# Patient Record
Sex: Female | Born: 1955
Health system: Southern US, Community
[De-identification: ages and names within clinical notes are randomized; demographics above are authoritative.]

## PROBLEM LIST (undated history)

## (undated) DIAGNOSIS — E7800 Pure hypercholesterolemia, unspecified: Secondary | ICD-10-CM

## (undated) DIAGNOSIS — E119 Type 2 diabetes mellitus without complications: Secondary | ICD-10-CM

## (undated) DIAGNOSIS — M1990 Unspecified osteoarthritis, unspecified site: Secondary | ICD-10-CM

## (undated) DIAGNOSIS — I10 Essential (primary) hypertension: Secondary | ICD-10-CM

## (undated) HISTORY — PX: CHOLECYSTECTOMY: SHX55

---

## 2009-02-08 ENCOUNTER — Emergency Department (HOSPITAL_BASED_OUTPATIENT_CLINIC_OR_DEPARTMENT_OTHER): Admission: EM | Admit: 2009-02-08 | Discharge: 2009-02-08 | Payer: Self-pay | Admitting: Emergency Medicine

## 2011-04-07 ENCOUNTER — Emergency Department (INDEPENDENT_AMBULATORY_CARE_PROVIDER_SITE_OTHER): Payer: Self-pay

## 2011-04-07 ENCOUNTER — Emergency Department (HOSPITAL_BASED_OUTPATIENT_CLINIC_OR_DEPARTMENT_OTHER)
Admission: EM | Admit: 2011-04-07 | Discharge: 2011-04-07 | Disposition: A | Discharge status: Home / Self Care | Payer: Self-pay | Attending: Emergency Medicine | Admitting: Emergency Medicine

## 2011-04-07 DIAGNOSIS — E7800 Pure hypercholesterolemia, unspecified: Secondary | ICD-10-CM | POA: Insufficient documentation

## 2011-04-07 DIAGNOSIS — I10 Essential (primary) hypertension: Secondary | ICD-10-CM | POA: Insufficient documentation

## 2011-04-07 DIAGNOSIS — J3489 Other specified disorders of nose and nasal sinuses: Secondary | ICD-10-CM | POA: Insufficient documentation

## 2011-04-07 DIAGNOSIS — R0981 Nasal congestion: Secondary | ICD-10-CM

## 2011-04-07 DIAGNOSIS — R198 Other specified symptoms and signs involving the digestive system and abdomen: Secondary | ICD-10-CM

## 2011-04-07 DIAGNOSIS — J069 Acute upper respiratory infection, unspecified: Secondary | ICD-10-CM | POA: Insufficient documentation

## 2011-04-07 DIAGNOSIS — R059 Cough, unspecified: Secondary | ICD-10-CM

## 2011-04-07 DIAGNOSIS — R51 Headache: Secondary | ICD-10-CM | POA: Insufficient documentation

## 2011-04-07 DIAGNOSIS — R05 Cough: Secondary | ICD-10-CM

## 2011-04-07 HISTORY — DX: Essential (primary) hypertension: I10

## 2011-04-07 HISTORY — DX: Pure hypercholesterolemia, unspecified: E78.00

## 2011-04-07 MED ORDER — ACETAMINOPHEN-CODEINE 120-12 MG/5ML PO SUSP: 10.0000 mL | ORAL | Status: AC

## 2011-04-07 MED ORDER — PREDNISONE 50 MG PO TABS: 50.0000 mg | ORAL_TABLET | Freq: Every day | ORAL | Status: AC

## 2011-04-07 NOTE — ED Notes (Signed)
C/o HA/facial pain,sinus congestion

## 2011-04-07 NOTE — ED Provider Notes (Signed)
History     CSN: [id number] Arrival date & time: 04/07/2011 11:33 AM   First MD Initiated Contact with Patient 04/07/11 1158      Chief Complaint  Patient presents with  . Facial Pain    (Consider location/radiation/quality/duration/timing/severity/associated sxs/prior treatment) The history is provided by the patient.   patient presents emergency Dept. with a two-day history of nasal congestion, runny nose, sore throat, cough, and chills.  The patient denies fever, vomiting, nausea, diarrhea, abdominal pain, chest pain,  shortness of breath, weakness, or myalgias.  She states that her her nose, and face are causing pain from the nasal congestion.   Past Medical History  Diagnosis Date  . Hypertension   . High cholesterol     Past Surgical History  Procedure Date  . Cholecystectomy     No family history on file.  History  Substance Use Topics  . Smoking status: Current Everyday Smoker  . Smokeless tobacco: Not on file  . Alcohol Use: No    OB History    Grav Para Term Preterm Abortions TAB SAB Ect Mult Living                  Review of Systems  All other systems reviewed and are negative.   All pertinent positives and negatives in the history of present illness. Allergies  Penicillins  Home Medications   Current Outpatient Rx  Name Route Sig Dispense Refill  . UNKNOWN TO PATIENT  BP/cholesterol meds       BP 148/86  Pulse 94  Temp(Src) 97.5 F (36.4 C) (Oral)  Resp 20  Wt 197 lb (89.359 kg)  SpO2 100%  Physical Exam  Constitutional: She is oriented to person, place, and time. She appears well-developed and well-nourished. No distress.  HENT:  Head: No trismus in the jaw.  Right Ear: Hearing, tympanic membrane and ear canal normal.  Left Ear: Hearing, tympanic membrane and ear canal normal.  Nose: Mucosal edema and rhinorrhea present. No epistaxis. Right sinus exhibits no maxillary sinus tenderness and no frontal sinus tenderness. Left sinus  exhibits no maxillary sinus tenderness and no frontal sinus tenderness.    Mouth/Throat: Uvula is midline, oropharynx is clear and moist and mucous membranes are normal. No uvula swelling. No oropharyngeal exudate, posterior oropharyngeal edema, posterior oropharyngeal erythema or tonsillar abscesses.  Eyes: Conjunctivae are normal. Pupils are equal, round, and reactive to light.  Neck: Normal range of motion. Neck supple.  Cardiovascular: Normal rate and regular rhythm.  Exam reveals no gallop and no friction rub.   No murmur heard. Pulmonary/Chest: Effort normal and breath sounds normal. No respiratory distress. She has no wheezes. She has no rales.  Neurological: She is alert and oriented to person, place, and time.  Skin: Skin is warm and dry. No rash noted. She is not diaphoretic.    ED Course  Procedures (including critical care time)  Labs Reviewed - No data to display Dg Chest 2 View  04/07/2011  *RADIOLOGY REPORT*  Clinical Data: Cough and chest congestion.  CHEST - 2 VIEW  Comparison: None.  Findings: The heart size and vascularity are normal and the lungs are clear.  No osseous abnormality.  IMPRESSION: Normal chest.  Original Report Authenticated By: Gwynn Burly, M.D.     Chest her x-ray was obtained due to the fact, the patient, states she has some discomfort with cough and burning sensation.     MDM  Patient has normal vital signs here in the emergency  department.  Her symptoms were most likely caused by viral upper respiratory illness is based on her history of present illness and physical examination.  Her chest x-ray was also negative as well.  She is told to increase her fluids.  Return here as needed for any worsening in her condition.        Jamesetta Orleans [place], [place] 04/07/11 1258

## 2011-04-10 NOTE — ED Provider Notes (Signed)
Medical screening examination/treatment/procedure(s) were performed by non-physician practitioner and as supervising physician I was immediately available for consultation/collaboration.    Suzi Roots, MD 04/10/11 [phone]

## 2012-01-14 ENCOUNTER — Encounter (HOSPITAL_BASED_OUTPATIENT_CLINIC_OR_DEPARTMENT_OTHER): Payer: Self-pay | Admitting: *Deleted

## 2012-01-14 ENCOUNTER — Emergency Department (HOSPITAL_BASED_OUTPATIENT_CLINIC_OR_DEPARTMENT_OTHER): Payer: Self-pay

## 2012-01-14 ENCOUNTER — Emergency Department (HOSPITAL_BASED_OUTPATIENT_CLINIC_OR_DEPARTMENT_OTHER)
Admission: EM | Admit: 2012-01-14 | Discharge: 2012-01-14 | Disposition: A | Discharge status: Home / Self Care | Payer: Self-pay | Attending: Emergency Medicine | Admitting: Emergency Medicine

## 2012-01-14 DIAGNOSIS — J40 Bronchitis, not specified as acute or chronic: Secondary | ICD-10-CM | POA: Insufficient documentation

## 2012-01-14 DIAGNOSIS — E7800 Pure hypercholesterolemia, unspecified: Secondary | ICD-10-CM | POA: Insufficient documentation

## 2012-01-14 DIAGNOSIS — I10 Essential (primary) hypertension: Secondary | ICD-10-CM | POA: Insufficient documentation

## 2012-01-14 DIAGNOSIS — Z880 Allergy status to penicillin: Secondary | ICD-10-CM | POA: Insufficient documentation

## 2012-01-14 DIAGNOSIS — F17200 Nicotine dependence, unspecified, uncomplicated: Secondary | ICD-10-CM | POA: Insufficient documentation

## 2012-01-14 MED ORDER — AZITHROMYCIN 250 MG PO TABS: 250.0000 mg | ORAL_TABLET | Freq: Every day | ORAL | Status: AC

## 2012-01-14 MED ORDER — HYDROCOD POLST-CHLORPHEN POLST 10-8 MG/5ML PO LQCR: 5.0000 mL | Freq: Every evening | ORAL | Status: DC | PRN

## 2012-01-14 NOTE — ED Provider Notes (Signed)
History     CSN: [id number]  Arrival date & time 01/14/12  4098   First MD Initiated Contact with Patient 01/14/12 1007      Chief Complaint  Patient presents with  . URI    (Consider location/radiation/quality/duration/timing/severity/associated sxs/prior treatment) Patient is a 56 y.o. female presenting with cough. The history is provided by the patient.  Cough This is a new problem. The current episode started more than 2 days ago. The problem occurs constantly. The problem has been gradually worsening. The cough is productive of sputum. There has been no fever. Associated symptoms include headaches and rhinorrhea. Pertinent negatives include no chest pain, no chills, no sore throat, no myalgias, no shortness of breath and no wheezing. Treatments tried: allergy med. The treatment provided no relief. She is a smoker. Her past medical history does not include COPD.    Past Medical History  Diagnosis Date  . Hypertension   . High cholesterol     Past Surgical History  Procedure Date  . Cholecystectomy     No family history on file.  History  Substance Use Topics  . Smoking status: Current Everyday Smoker  . Smokeless tobacco: Not on file  . Alcohol Use: No    OB History    Grav Para Term Preterm Abortions TAB SAB Ect Mult Living                  Review of Systems  Constitutional: Negative for chills.  HENT: Positive for congestion and rhinorrhea. Negative for sore throat and neck pain.   Respiratory: Positive for cough. Negative for shortness of breath and wheezing.   Cardiovascular: Negative for chest pain.  Gastrointestinal: Positive for vomiting (post tussive). Negative for nausea.  Musculoskeletal: Negative for myalgias.  Neurological: Positive for headaches.    Allergies  Penicillins  Home Medications   Current Outpatient Rx  Name Route Sig Dispense Refill  . SIMVASTATIN PO Oral Take by mouth.    . AZITHROMYCIN 250 MG PO TABS Oral Take 1 tablet (250  mg total) by mouth daily. Take first 2 tablets together, then 1 every day until finished. 6 tablet 0  . HYDROCOD POLST-CPM POLST ER 10-8 MG/5ML PO LQCR Oral Take 5 mLs by mouth at bedtime as needed. 140 mL 0  . UNKNOWN TO PATIENT  BP/cholesterol meds       BP 153/86  Pulse 94  Temp 98.8 F (37.1 C) (Oral)  Resp 18  Ht 5\' 5"  (1.651 m)  Wt 197 lb (89.359 kg)  BMI 32.78 kg/m2  SpO2 97%  Physical Exam  Constitutional: She is oriented to person, place, and time. She appears well-developed and well-nourished.  HENT:  Head: Normocephalic and atraumatic.  Right Ear: External ear normal.  Left Ear: External ear normal.  Mouth/Throat: Oropharynx is clear and moist.  Eyes: Pupils are equal, round, and reactive to light.  Neck: Normal range of motion. Neck supple.  Cardiovascular: Normal rate, regular rhythm and normal heart sounds.   Pulmonary/Chest: Effort normal and breath sounds normal. No respiratory distress. She has no wheezes. She has no rales. She exhibits no tenderness.  Abdominal: Soft. Bowel sounds are normal. There is no tenderness. There is no rebound and no guarding.  Musculoskeletal: Normal range of motion. She exhibits no edema.  Lymphadenopathy:    She has no cervical adenopathy.  Neurological: She is alert and oriented to person, place, and time.  Skin: Skin is warm and dry. No rash noted.  Psychiatric: She has  a normal mood and affect.    ED Course  Procedures (including critical care time) No results found for this or any previous visit. Dg Chest 2 View  01/14/2012  *RADIOLOGY REPORT*  Clinical Data: Cough, headache, vomiting and diarrhea.  CHEST - 2 VIEW  Comparison: 04/07/2011.  Findings: The heart remains normal in size and the lungs are clear. Thoracic spine degenerative changes.  Cholecystectomy clips.  IMPRESSION: No acute abnormality.   Original Report Authenticated By: Darrol Angel, M.D.        1. Bronchitis       MDM  No evidence of pneumonia.  Pt  is smoker with worsening bronchitis symptoms.  Will tx with z-pack and tussionex.  F/u with her PMD         Rolan Bucco, MD 01/14/12 1106

## 2012-01-14 NOTE — ED Notes (Signed)
Patient reports cold symptoms since thursday, productive cough yellow sputum, HA, sinus pressure, some diarrhea this AM, cough & vomits, took otc sinus meds

## 2012-09-21 ENCOUNTER — Encounter (HOSPITAL_BASED_OUTPATIENT_CLINIC_OR_DEPARTMENT_OTHER): Payer: Self-pay | Admitting: *Deleted

## 2012-09-21 ENCOUNTER — Emergency Department (HOSPITAL_BASED_OUTPATIENT_CLINIC_OR_DEPARTMENT_OTHER)
Admission: EM | Admit: 2012-09-21 | Discharge: 2012-09-21 | Disposition: A | Discharge status: Home / Self Care | Payer: Self-pay | Attending: Emergency Medicine | Admitting: Emergency Medicine

## 2012-09-21 DIAGNOSIS — Y929 Unspecified place or not applicable: Secondary | ICD-10-CM | POA: Insufficient documentation

## 2012-09-21 DIAGNOSIS — S46912A Strain of unspecified muscle, fascia and tendon at shoulder and upper arm level, left arm, initial encounter: Secondary | ICD-10-CM

## 2012-09-21 DIAGNOSIS — F17200 Nicotine dependence, unspecified, uncomplicated: Secondary | ICD-10-CM | POA: Insufficient documentation

## 2012-09-21 DIAGNOSIS — E7800 Pure hypercholesterolemia, unspecified: Secondary | ICD-10-CM | POA: Insufficient documentation

## 2012-09-21 DIAGNOSIS — Z79899 Other long term (current) drug therapy: Secondary | ICD-10-CM | POA: Insufficient documentation

## 2012-09-21 DIAGNOSIS — Y939 Activity, unspecified: Secondary | ICD-10-CM | POA: Insufficient documentation

## 2012-09-21 DIAGNOSIS — I10 Essential (primary) hypertension: Secondary | ICD-10-CM | POA: Insufficient documentation

## 2012-09-21 DIAGNOSIS — IMO0002 Reserved for concepts with insufficient information to code with codable children: Secondary | ICD-10-CM | POA: Insufficient documentation

## 2012-09-21 DIAGNOSIS — Z880 Allergy status to penicillin: Secondary | ICD-10-CM | POA: Insufficient documentation

## 2012-09-21 DIAGNOSIS — X58XXXA Exposure to other specified factors, initial encounter: Secondary | ICD-10-CM | POA: Insufficient documentation

## 2012-09-21 DIAGNOSIS — Z8739 Personal history of other diseases of the musculoskeletal system and connective tissue: Secondary | ICD-10-CM | POA: Insufficient documentation

## 2012-09-21 HISTORY — DX: Unspecified osteoarthritis, unspecified site: M19.90

## 2012-09-21 MED ORDER — IBUPROFEN 600 MG PO TABS: 600.0000 mg | ORAL_TABLET | Freq: Four times a day (QID) | ORAL | Status: DC | PRN

## 2012-09-21 MED ORDER — OXYCODONE-ACETAMINOPHEN 5-325 MG PO TABS: 2.0000 | ORAL_TABLET | ORAL | Status: DC | PRN

## 2012-09-21 MED ORDER — KETOROLAC TROMETHAMINE 30 MG/ML IJ SOLN
30.0000 mg | Freq: Once | INTRAMUSCULAR | Status: AC
  Administered 2012-09-21: 30 mg via INTRAMUSCULAR
  Filled 2012-09-21: qty 1

## 2012-09-21 MED ORDER — METHOCARBAMOL 500 MG PO TABS: 500.0000 mg | ORAL_TABLET | Freq: Two times a day (BID) | ORAL | Status: DC

## 2012-09-21 NOTE — ED Provider Notes (Signed)
History     CSN: [id number]  Arrival date & time 09/21/12  1344   First MD Initiated Contact with Patient 09/21/12 1403      Chief Complaint  Patient presents with  . Shoulder Pain    (Consider location/radiation/quality/duration/timing/severity/associated sxs/prior treatment) Patient is a 57 y.o. female presenting with shoulder pain. The history is provided by the patient.  Shoulder Pain   patient here complaining of left shoulder pain x1 week that is worse with movement. Pain localized to her left posterior shoulder and characterized as sharp. No recent trauma. No rashes. No anginal type symptoms. Has used over-the-counter medications without relief. Does have history of arthritis.  Past Medical History  Diagnosis Date  . Hypertension   . High cholesterol   . Arthritis     Past Surgical History  Procedure Laterality Date  . Cholecystectomy      History reviewed. No pertinent family history.  History  Substance Use Topics  . Smoking status: Current Every Day Smoker  . Smokeless tobacco: Not on file  . Alcohol Use: No    OB History   Grav Para Term Preterm Abortions TAB SAB Ect Mult Living                  Review of Systems  All other systems reviewed and are negative.    Allergies  Penicillins  Home Medications   Current Outpatient Rx  Name  Route  Sig  Dispense  Refill  . chlorpheniramine-HYDROcodone (TUSSIONEX PENNKINETIC ER) 10-8 MG/5ML LQCR   Oral   Take 5 mLs by mouth at bedtime as needed.   140 mL   0   . SIMVASTATIN PO   Oral   Take by mouth.         Marland Kitchen UNKNOWN TO PATIENT      BP/cholesterol meds            BP 143/78  Pulse 100  Temp(Src) 98.4 F (36.9 C) (Oral)  Resp 20  Ht 5\' 5"  (1.651 m)  Wt 190 lb (86.183 kg)  BMI 31.62 kg/m2  SpO2 100%  Physical Exam  Nursing note and vitals reviewed. Constitutional: She is oriented to person, place, and time. She appears well-developed and well-nourished.  Non-toxic appearance. No  distress.  HENT:  Head: Normocephalic and atraumatic.  Eyes: Conjunctivae, EOM and lids are normal. Pupils are equal, round, and reactive to light.  Neck: Normal range of motion. Neck supple. No tracheal deviation present. No mass present.  Cardiovascular: Normal rate, regular rhythm and normal heart sounds.  Exam reveals no gallop.   No murmur heard. Pulmonary/Chest: Effort normal and breath sounds normal. No stridor. No respiratory distress. She has no decreased breath sounds. She has no wheezes. She has no rhonchi. She has no rales.  Abdominal: Soft. Normal appearance and bowel sounds are normal. She exhibits no distension. There is no tenderness. There is no rebound and no CVA tenderness.  Musculoskeletal: Normal range of motion. She exhibits no edema and no tenderness.       Arms: Neurological: She is alert and oriented to person, place, and time. She has normal strength. No cranial nerve deficit or sensory deficit. GCS eye subscore is 4. GCS verbal subscore is 5. GCS motor subscore is 6.  Skin: Skin is warm and dry. No abrasion and no rash noted.  Psychiatric: She has a normal mood and affect. Her speech is normal and behavior is normal.    ED Course  Procedures (including critical care  time)  Labs Reviewed - No data to display No results found.   No diagnosis found.    MDM  Pt with musculoskeletal shoulder strain--will treat        Toy Baker, MD 09/21/12 1423

## 2012-09-21 NOTE — ED Notes (Signed)
Pt states she has had left shoulder and arm pain x 1 week. Denies other s/s. Hurts to move. No relief with Tylenol or Arthritis meds. Hx arthritis years ago,

## 2015-05-12 ENCOUNTER — Encounter (HOSPITAL_BASED_OUTPATIENT_CLINIC_OR_DEPARTMENT_OTHER): Payer: Self-pay | Admitting: *Deleted

## 2015-05-12 ENCOUNTER — Emergency Department (HOSPITAL_BASED_OUTPATIENT_CLINIC_OR_DEPARTMENT_OTHER)
Admission: EM | Admit: 2015-05-12 | Discharge: 2015-05-12 | Disposition: A | Discharge status: Home / Self Care | Payer: Self-pay | Attending: Emergency Medicine | Admitting: Emergency Medicine

## 2015-05-12 DIAGNOSIS — H1013 Acute atopic conjunctivitis, bilateral: Secondary | ICD-10-CM | POA: Insufficient documentation

## 2015-05-12 DIAGNOSIS — F17200 Nicotine dependence, unspecified, uncomplicated: Secondary | ICD-10-CM | POA: Insufficient documentation

## 2015-05-12 DIAGNOSIS — R0981 Nasal congestion: Secondary | ICD-10-CM | POA: Insufficient documentation

## 2015-05-12 DIAGNOSIS — J309 Allergic rhinitis, unspecified: Secondary | ICD-10-CM | POA: Insufficient documentation

## 2015-05-12 DIAGNOSIS — Z8739 Personal history of other diseases of the musculoskeletal system and connective tissue: Secondary | ICD-10-CM | POA: Insufficient documentation

## 2015-05-12 DIAGNOSIS — E7800 Pure hypercholesterolemia, unspecified: Secondary | ICD-10-CM | POA: Insufficient documentation

## 2015-05-12 DIAGNOSIS — I10 Essential (primary) hypertension: Secondary | ICD-10-CM | POA: Insufficient documentation

## 2015-05-12 DIAGNOSIS — Z79899 Other long term (current) drug therapy: Secondary | ICD-10-CM | POA: Insufficient documentation

## 2015-05-12 DIAGNOSIS — Z880 Allergy status to penicillin: Secondary | ICD-10-CM | POA: Insufficient documentation

## 2015-05-12 MED ORDER — LORATADINE 10 MG PO TABS: 10.0000 mg | ORAL_TABLET | Freq: Every day | ORAL | Status: DC

## 2015-05-12 MED ORDER — POLYMYXIN B-TRIMETHOPRIM 10000-0.1 UNIT/ML-% OP SOLN: 1.0000 [drp] | OPHTHALMIC | Status: DC

## 2015-05-12 MED ORDER — KETOTIFEN FUMARATE 0.025 % OP SOLN: 1.0000 [drp] | Freq: Two times a day (BID) | OPHTHALMIC | Status: DC

## 2015-05-12 NOTE — ED Notes (Signed)
Pt amb to triage with quick steady gait smiling in nad. Pt reports "bloodshot" eyes x 2 weeks. Denies any discharge or matting, no visual changes, no injury.

## 2015-05-12 NOTE — Discharge Instructions (Signed)
Read the information below.  Use the prescribed medication as directed.  Please discuss all new medications with your pharmacist.  You may return to the Emergency Department at any time for worsening condition or any new symptoms that concern you.     If you develop worsening pain in your eye, change in your vision, swelling around your eye, difficulty moving your eye, or fevers greater than 100.4, see your eye doctor or return to the Emergency Department immediately for a recheck.      Allergic Conjunctivitis Allergic conjunctivitis is inflammation of the clear membrane that covers the white part of your eye and the inner surface of your eyelid (conjunctiva), and it is caused by allergies. The blood vessels in the conjunctiva become inflamed, and this causes the eye to become red or pink, and it often causes itchiness in the eye. Allergic conjunctivitis cannot be spread by one person to another person (noncontagious). CAUSES This condition is caused by an allergic reaction. Common causes of an allergic reaction (allergens) include:  Dust.  Pollen.  Mold.  Animal dander or secretions. RISK FACTORS This condition is more likely to develop if you are exposed to high levels of allergens that cause the allergic reaction. This might include being outdoors when air pollen levels are high or being around animals that you are allergic to. SYMPTOMS Symptoms of this condition may include:  Eye redness.  Tearing of the eyes.  Watery eyes.  Itchy eyes.  Burning feeling in the eyes.  Clear drainage from the eyes.  Swollen eyelids. DIAGNOSIS This condition may be diagnosed by medical history and physical exam. If you have drainage from your eyes, it may be tested to rule out other causes of conjunctivitis. TREATMENT Treatment for this condition often includes medicines. These may be eye drops, ointments, or oral medicines. They may be prescription medicines or over-the-counter medicines. HOME  CARE INSTRUCTIONS  Take or apply medicines only as directed by your health care provider.  Do not touch or rub your eyes.  Do not wear contact lenses until the inflammation is gone. Wear glasses instead.  Do not wear eye makeup until the inflammation is gone.  Apply a cool, clean washcloth to your eye for 10-20 minutes, 3-4 times a day.  Try to avoid whatever allergen is causing the allergic reaction. SEEK MEDICAL CARE IF:  Your symptoms get worse.  You have pus draining from your eye.  You have new symptoms.  You have a fever.   This information is not intended to replace advice given to you by your health care provider. Make sure you discuss any questions you have with your health care provider.   Document Released: 07/22/2002 Document Revised: 05/22/2014 Document Reviewed: 02/10/2014 Elsevier Interactive Patient Education [employer]2016 [employer]

## 2015-05-12 NOTE — ED Notes (Signed)
Patient stable and ambulatory.  Patient verbalizes understanding of discharge medications, instructions and follow-up. 

## 2015-05-12 NOTE — ED Provider Notes (Signed)
CSN: [id number]     Arrival date & time 05/12/15  1512 History   First MD Initiated Contact with Patient 05/12/15 1611     Chief Complaint  Patient presents with  . Eye Problem     (Consider location/radiation/quality/duration/timing/severity/associated sxs/prior Treatment) HPI   Pt presents with many weeks of bilateral eye redness, itching, crusting at night.  Feels a film over her eyes.  States she always have allergic rhinitis, this is unchanged.  Notes her son used her eyeliner at some point and then she used it, unsure if this could have contributed.  Denies eye injury/trauma, possibility of FB, chemical exposure.  She does not wear contacts.   Denies visual changes, URI symptoms, fevers.  Has tried warm salt water and tea bags, various OTC eye drops without improvement.    Past Medical History  Diagnosis Date  . Hypertension   . High cholesterol   . Arthritis    Past Surgical History  Procedure Laterality Date  . Cholecystectomy     History reviewed. No pertinent family history. Social History  Substance Use Topics  . Smoking status: Current Every Day Smoker  . Smokeless tobacco: None  . Alcohol Use: No   OB History    No data available     Review of Systems  Constitutional: Negative for fever and chills.  HENT: Positive for congestion. Negative for dental problem, ear pain, mouth sores, rhinorrhea, sore throat and trouble swallowing.   Eyes: Positive for pain, discharge, redness and itching. Negative for photophobia and visual disturbance.  Respiratory: Negative for cough, shortness of breath, wheezing and stridor.   Cardiovascular: Negative for chest pain.  Musculoskeletal: Negative for neck pain and neck stiffness.  Skin: Negative for color change.  Allergic/Immunologic: Negative for immunocompromised state.  Psychiatric/Behavioral: Negative for self-injury.      Allergies  Penicillins  Home Medications   Prior to Admission medications   Medication Sig  Start Date End Date Taking? Authorizing Provider  amLODipine (NORVASC) 10 MG tablet Take 10 mg by mouth daily.   Yes Historical Provider, MD  hydrochlorothiazide (HYDRODIURIL) 25 MG tablet Take 25 mg by mouth daily.   Yes Historical Provider, MD  ketotifen (ZADITOR) 0.025 % ophthalmic solution Place 1 drop into both eyes 2 (two) times daily. (allergic symptoms) 05/12/15   Trixie DredgeEmily Kamren Heskett, PA-C  loratadine (CLARITIN) 10 MG tablet Take 1 tablet (10 mg total) by mouth daily. 05/12/15   Trixie DredgeEmily Dashley Monts, PA-C  trimethoprim-polymyxin b (POLYTRIM) ophthalmic solution Place 1 drop into the right eye every 4 (four) hours. (bacterial infection) 05/12/15   Trixie DredgeEmily Sheril Hammond, PA-C  UNKNOWN TO PATIENT BP/cholesterol meds     Historical Provider, MD   BP 141/90 mmHg  Pulse 95  Temp(Src) 98.5 F (36.9 C) (Oral)  Resp 20  Ht 5\' 7"  (1.702 m)  Wt 85.73 kg  BMI 29.59 kg/m2  SpO2 100% Physical Exam  Constitutional: She appears well-developed and well-nourished. No distress.  HENT:  Head: Normocephalic and atraumatic.  Eyes: EOM and lids are normal. Pupils are equal, round, and reactive to light. Right eye exhibits no discharge. Left eye exhibits no discharge. Right conjunctiva is injected. Right conjunctiva has no hemorrhage. Left conjunctiva is injected. Left conjunctiva has no hemorrhage. No scleral icterus.  Very mild injection of bilateral conjunctiva.   Neck: Neck supple.  Pulmonary/Chest: Effort normal.  Neurological: She is alert.  Skin: She is not diaphoretic.  Nursing note and vitals reviewed.   ED Course  Procedures (including critical care time)  Labs Review Labs Reviewed - No data to display  Imaging Review No results found. I have personally reviewed and evaluated these images and lab results as part of my medical decision-making.   EKG Interpretation None      MDM   Final diagnoses:  Allergic conjunctivitis, bilateral    Afebrile, nontoxic patient with bilateral eye irritation x many weeks.   Has chronic allergic rhinitis.  Eyes are itching and red, likely allergic conjunctivitis.  Pt did have exposure to used eyeliner that could possibly be contributing.  Will treat for allergic conjunctivitis but have provided pt with polytrim prescription to use if zaditor and claritin are not helping.     D/C home with PCP follow up PRN.  Discussed result, findings, treatment, and follow up  with patient.  Pt given return precautions.  Pt verbalizes understanding and agrees with plan.         Trixie Dredge, PA-C 05/12/15 1711  Tilden Fossa, MD 05/13/15 [phone]

## 2018-08-06 ENCOUNTER — Encounter (HOSPITAL_BASED_OUTPATIENT_CLINIC_OR_DEPARTMENT_OTHER): Payer: Self-pay | Admitting: Emergency Medicine

## 2018-08-06 ENCOUNTER — Emergency Department (HOSPITAL_BASED_OUTPATIENT_CLINIC_OR_DEPARTMENT_OTHER)
Admission: EM | Admit: 2018-08-06 | Discharge: 2018-08-06 | Disposition: A | Discharge status: Home / Self Care | Payer: Self-pay | Attending: Emergency Medicine | Admitting: Emergency Medicine

## 2018-08-06 ENCOUNTER — Other Ambulatory Visit: Payer: Self-pay

## 2018-08-06 DIAGNOSIS — Y9384 Activity, sleeping: Secondary | ICD-10-CM | POA: Insufficient documentation

## 2018-08-06 DIAGNOSIS — I10 Essential (primary) hypertension: Secondary | ICD-10-CM | POA: Insufficient documentation

## 2018-08-06 DIAGNOSIS — E119 Type 2 diabetes mellitus without complications: Secondary | ICD-10-CM | POA: Insufficient documentation

## 2018-08-06 DIAGNOSIS — F17200 Nicotine dependence, unspecified, uncomplicated: Secondary | ICD-10-CM | POA: Insufficient documentation

## 2018-08-06 DIAGNOSIS — Z7984 Long term (current) use of oral hypoglycemic drugs: Secondary | ICD-10-CM | POA: Insufficient documentation

## 2018-08-06 DIAGNOSIS — X58XXXA Exposure to other specified factors, initial encounter: Secondary | ICD-10-CM | POA: Insufficient documentation

## 2018-08-06 DIAGNOSIS — Y998 Other external cause status: Secondary | ICD-10-CM | POA: Insufficient documentation

## 2018-08-06 DIAGNOSIS — Y92003 Bedroom of unspecified non-institutional (private) residence as the place of occurrence of the external cause: Secondary | ICD-10-CM | POA: Insufficient documentation

## 2018-08-06 DIAGNOSIS — S0502XA Injury of conjunctiva and corneal abrasion without foreign body, left eye, initial encounter: Secondary | ICD-10-CM | POA: Insufficient documentation

## 2018-08-06 DIAGNOSIS — Z79899 Other long term (current) drug therapy: Secondary | ICD-10-CM | POA: Insufficient documentation

## 2018-08-06 DIAGNOSIS — Z9049 Acquired absence of other specified parts of digestive tract: Secondary | ICD-10-CM | POA: Insufficient documentation

## 2018-08-06 HISTORY — DX: Type 2 diabetes mellitus without complications: E11.9

## 2018-08-06 MED ORDER — FLUORESCEIN SODIUM 1 MG OP STRP
1.0000 | ORAL_STRIP | Freq: Once | OPHTHALMIC | Status: AC
  Administered 2018-08-06: 1 via OPHTHALMIC

## 2018-08-06 MED ORDER — ERYTHROMYCIN 5 MG/GM OP OINT: TOPICAL_OINTMENT | OPHTHALMIC | 0 refills | Status: AC

## 2018-08-06 MED ORDER — FLUORESCEIN SODIUM 1 MG OP STRP
ORAL_STRIP | OPHTHALMIC | Status: AC
  Administered 2018-08-06: 1 via OPHTHALMIC
  Filled 2018-08-06: qty 1

## 2018-08-06 MED ORDER — TETRACAINE HCL 0.5 % OP SOLN
OPHTHALMIC | Status: AC
  Administered 2018-08-06: 1 [drp] via OPHTHALMIC
  Filled 2018-08-06: qty 4

## 2018-08-06 MED ORDER — TETRACAINE HCL 0.5 % OP SOLN
1.0000 [drp] | Freq: Once | OPHTHALMIC | Status: AC
  Administered 2018-08-06: 1 [drp] via OPHTHALMIC

## 2018-08-06 MED FILL — ERYTHROMYCIN EYE OINTMENT: 5 | 15 days supply | Qty: 4 | Fill #0

## 2018-08-06 NOTE — ED Provider Notes (Signed)
[facility] EMERGENCY DEPARTMENT Provider Note   CSN: [id number] Arrival date & time: 08/06/18  0813    History   Chief Complaint Chief Complaint  Patient presents with  . Eye Pain    HPI Melissa Carlson is a 63 y.o. female.     HPI   Feels like "rocks" cutting eye ball Woke up with pain at 330AM. Thinks may have gotten something in it but not sure what or when. Blurred vision started around the same time. Left eye only. No discharge.Had some tearing. Was rubbing it. Not sure if rubbed something into it. Burning pain.  Was itching in both eyes some.  Runny nose, bought some sinus tablets No cough or fever. Tried to put water in it. No known hx of eye problems.    Past Medical History:  Diagnosis Date  . Arthritis   . Diabetes mellitus without complication (HCC)   . High cholesterol   . Hypertension     There are no active problems to display for this patient.   Past Surgical History:  Procedure Laterality Date  . CHOLECYSTECTOMY       OB History   No obstetric history on file.      Home Medications    Prior to Admission medications   Medication Sig Start Date End Date Taking? Authorizing Provider  erythromycin ophthalmic ointment Place a 1/2 inch ribbon of ointment into the lower eyelid four times daily for one week. 08/06/18   Alvira Monday, MD  hydrochlorothiazide (HYDRODIURIL) 25 MG tablet Take 25 mg by mouth daily.    [provider]  losartan (COZAAR) 100 MG tablet  06/06/18   [provider]  metFORMIN (GLUCOPHAGE-XR) 500 MG 24 hr tablet  05/10/18   [provider]    Family History No family history on file.  Social History Social History   Tobacco Use  . Smoking status: Current Every Day Smoker  Substance Use Topics  . Alcohol use: No  . Drug use: Not on file     Allergies   Penicillins   Review of Systems Review of Systems  Constitutional: Negative for fever.  HENT: Negative for  congestion and sore throat.   Eyes: Positive for pain, discharge, redness, itching and visual disturbance.  Respiratory: Negative for cough.   Skin: Negative for rash.  Neurological: Negative for syncope.     Physical Exam Updated Vital Signs BP (!) 155/83 (BP Location: Right Arm)   Pulse 93   Temp 98.2 F (36.8 C)   Resp 16   Ht 5\' 5"  (1.651 m)   Wt 86.2 kg   SpO2 100%   BMI 31.62 kg/m   Physical Exam Vitals signs and nursing note reviewed.  Constitutional:      General: She is not in acute distress.    Appearance: She is well-developed. She is not diaphoretic.  HENT:     Head: Normocephalic and atraumatic.  Eyes:     General:        Right eye: No foreign body or discharge.        Left eye: No foreign body or discharge.     Extraocular Movements: Extraocular movements intact.     Right eye: Normal extraocular motion.     Left eye: Normal extraocular motion.     Conjunctiva/sclera:     Left eye: Left conjunctiva is injected.     Pupils: Pupils are equal, round, and reactive to light.     Left eye: Corneal abrasion (over  pupil) and fluorescein uptake present.  Neck:     Musculoskeletal: Normal range of motion.  Cardiovascular:     Rate and Rhythm: Normal rate and regular rhythm.     Heart sounds: Normal heart sounds. No murmur. No friction rub. No gallop.   Pulmonary:     Effort: Pulmonary effort is normal. No respiratory distress.     Breath sounds: Normal breath sounds. No wheezing or rales.  Abdominal:     General: There is no distension.     Palpations: Abdomen is soft.     Tenderness: There is no abdominal tenderness. There is no guarding.  Musculoskeletal:        General: No tenderness.  Skin:    General: Skin is warm and dry.     Findings: No erythema or rash.  Neurological:     Mental Status: She is alert and oriented to person, place, and time.      ED Treatments / Results  Labs (all labs ordered are listed, but only abnormal results are  displayed) Labs Reviewed - No data to display  EKG None  Radiology No results found.  Procedures Procedures (including critical care time)  Medications Ordered in ED Medications  tetracaine (PONTOCAINE) 0.5 % ophthalmic solution 1 drop (1 drop Left Eye Given 08/06/18 0907)  fluorescein ophthalmic strip 1 strip (1 strip Left Eye Given 08/06/18 0908)    Visual Acuity  Right Eye Distance: 20/30 Left Eye Distance: 20/200 Bilateral Distance: 20/25(with glasses)  Right Eye Near:   Left Eye Near:    Bilateral Near:       Initial Impression / Assessment and Plan / ED Course  I have reviewed the triage vital signs and the nursing notes.  Pertinent labs & imaging results that were available during my care of the patient were reviewed by me and considered in my medical decision making (see chart for details).        63yo female presents with concern for left eye pain and tearing.  Normal pupillary exam, with exam and history not consistent with acute glaucoma, orbital cellulitis or periorbital cellulitis.  Exam is significant for corneal abrasion with fluorescein uptake directly over the pupil, which is likely cause of patient's poor vision out of the left eye. Not contact lens wearers, non-dendritic, no sign of foreign body.  Given erythromycin ointment, number for ophthalmology.  Did discuss that in setting of global pain tonight she will be unable to have routine follow-up for this, however if she has worsening of her symptoms, she may call the number provided or return to ED.   Final Clinical Impressions(s) / ED Diagnoses   Final diagnoses:  Abrasion of left cornea, initial encounter    ED Discharge Orders         Ordered    erythromycin ophthalmic ointment     08/06/18 0941           Alvira Monday, MD 08/06/18 1422

## 2018-08-06 NOTE — ED Notes (Signed)
ED Provider at bedside. 

## 2018-08-06 NOTE — ED Triage Notes (Signed)
Patient here with c/o left eye pain since 0300. Does not wear contacts, no know injury. Does state her vision is more blurred in that eye.

## 2018-08-06 NOTE — ED Notes (Signed)
Patient verbalizes understanding of discharge instructions. Opportunity for questioning and answers were provided. Armband removed by staff, pt discharged from ED.  

## 2018-09-12 ENCOUNTER — Emergency Department (HOSPITAL_BASED_OUTPATIENT_CLINIC_OR_DEPARTMENT_OTHER)
Admission: EM | Admit: 2018-09-12 | Discharge: 2018-09-12 | Disposition: A | Discharge status: Home / Self Care | Payer: Self-pay | Attending: Emergency Medicine | Admitting: Emergency Medicine

## 2018-09-12 ENCOUNTER — Encounter (HOSPITAL_BASED_OUTPATIENT_CLINIC_OR_DEPARTMENT_OTHER): Payer: Self-pay | Admitting: *Deleted

## 2018-09-12 ENCOUNTER — Other Ambulatory Visit: Payer: Self-pay

## 2018-09-12 ENCOUNTER — Emergency Department (HOSPITAL_BASED_OUTPATIENT_CLINIC_OR_DEPARTMENT_OTHER): Payer: Self-pay

## 2018-09-12 DIAGNOSIS — M545 Low back pain, unspecified: Secondary | ICD-10-CM

## 2018-09-12 DIAGNOSIS — I10 Essential (primary) hypertension: Secondary | ICD-10-CM | POA: Insufficient documentation

## 2018-09-12 DIAGNOSIS — Z9049 Acquired absence of other specified parts of digestive tract: Secondary | ICD-10-CM | POA: Insufficient documentation

## 2018-09-12 DIAGNOSIS — F17200 Nicotine dependence, unspecified, uncomplicated: Secondary | ICD-10-CM | POA: Insufficient documentation

## 2018-09-12 DIAGNOSIS — E119 Type 2 diabetes mellitus without complications: Secondary | ICD-10-CM | POA: Insufficient documentation

## 2018-09-12 DIAGNOSIS — Z7984 Long term (current) use of oral hypoglycemic drugs: Secondary | ICD-10-CM | POA: Insufficient documentation

## 2018-09-12 DIAGNOSIS — Z79899 Other long term (current) drug therapy: Secondary | ICD-10-CM | POA: Insufficient documentation

## 2018-09-12 LAB — URINALYSIS, MICROSCOPIC (REFLEX): WBC, UA: NONE SEEN WBC/hpf (ref 0–5)

## 2018-09-12 LAB — CBC WITH DIFFERENTIAL/PLATELET
Abs Immature Granulocytes: 0.04 10*3/uL (ref 0.00–0.07)
Basophils Absolute: 0.1 10*3/uL (ref 0.0–0.1)
Basophils Relative: 0 %
Eosinophils Absolute: 0.2 10*3/uL (ref 0.0–0.5)
Eosinophils Relative: 2 %
HCT: 45.3 % (ref 36.0–46.0)
Hemoglobin: 14.5 g/dL (ref 12.0–15.0)
Immature Granulocytes: 0 %
Lymphocytes Relative: 32 %
Lymphs Abs: 4.3 10*3/uL — ABNORMAL HIGH (ref 0.7–4.0)
MCH: 29.8 pg (ref 26.0–34.0)
MCHC: 32 g/dL (ref 30.0–36.0)
MCV: 93.2 fL (ref 80.0–100.0)
Monocytes Absolute: 0.8 10*3/uL (ref 0.1–1.0)
Monocytes Relative: 6 %
Neutro Abs: 8.1 10*3/uL — ABNORMAL HIGH (ref 1.7–7.7)
Neutrophils Relative %: 60 %
Platelets: 281 10*3/uL (ref 150–400)
RBC: 4.86 MIL/uL (ref 3.87–5.11)
RDW: 13.7 % (ref 11.5–15.5)
WBC: 13.5 10*3/uL — ABNORMAL HIGH (ref 4.0–10.5)
nRBC: 0 % (ref 0.0–0.2)

## 2018-09-12 LAB — URINALYSIS, ROUTINE W REFLEX MICROSCOPIC
Bilirubin Urine: NEGATIVE
Glucose, UA: NEGATIVE mg/dL
Ketones, ur: NEGATIVE mg/dL
Leukocytes,Ua: NEGATIVE
Nitrite: NEGATIVE
Protein, ur: 100 mg/dL — AB
Specific Gravity, Urine: 1.015 (ref 1.005–1.030)
pH: 8 (ref 5.0–8.0)

## 2018-09-12 LAB — COMPREHENSIVE METABOLIC PANEL
ALT: 19 U/L (ref 0–44)
AST: 23 U/L (ref 15–41)
Albumin: 4.2 g/dL (ref 3.5–5.0)
Alkaline Phosphatase: 79 U/L (ref 38–126)
Anion gap: 11 (ref 5–15)
BUN: 19 mg/dL (ref 8–23)
CO2: 26 mmol/L (ref 22–32)
Calcium: 9.8 mg/dL (ref 8.9–10.3)
Chloride: 98 mmol/L (ref 98–111)
Creatinine, Ser: 1.61 mg/dL — ABNORMAL HIGH (ref 0.44–1.00)
GFR calc Af Amer: 39 mL/min — ABNORMAL LOW (ref >60)
GFR calc non Af Amer: 34 mL/min — ABNORMAL LOW (ref >60)
Glucose, Bld: 145 mg/dL — ABNORMAL HIGH (ref 70–99)
Potassium: 3.5 mmol/L (ref 3.5–5.1)
Sodium: 135 mmol/L (ref 135–145)
Total Bilirubin: 0.3 mg/dL (ref 0.3–1.2)
Total Protein: 8.5 g/dL — ABNORMAL HIGH (ref 6.5–8.1)

## 2018-09-12 MED ORDER — SODIUM CHLORIDE 0.9 % IV BOLUS
1000.0000 mL | Freq: Once | INTRAVENOUS | Status: AC
  Administered 2018-09-12: 1000 mL via INTRAVENOUS

## 2018-09-12 NOTE — ED Notes (Signed)
ED Provider at bedside. 

## 2018-09-12 NOTE — ED Provider Notes (Signed)
[facility] EMERGENCY DEPARTMENT Provider Note   CSN: [id number] Arrival date & time: 09/12/18  1910    History   Chief Complaint Chief Complaint  Patient presents with  . Back Pain  . Abdominal Pain    HPI Melissa Carlson is a 63 y.o. female.     HPI  63 year old female presents with lower abdominal discomfort and back pain.  Has been ongoing for about 3 days.  She is been trying Rolaids without relief.  She states the pain seems to get worse whenever she urinates and there is a little bit of burning.  She also has pain whenever she tries to sit up in her back.  The act of getting up from seated or laying down position makes the pain the worst.  However at times the pain will come out of nowhere and hurt her.  It is more on the right side of her back and minimal to no on the left.  There is no fever or vomiting.  No current pain.  Past Medical History:  Diagnosis Date  . Arthritis   . Diabetes mellitus without complication (HCC)   . High cholesterol   . Hypertension     There are no active problems to display for this patient.   Past Surgical History:  Procedure Laterality Date  . CHOLECYSTECTOMY       OB History   No obstetric history on file.      Home Medications    Prior to Admission medications   Medication Sig Start Date End Date Taking? Authorizing Provider  hydrochlorothiazide (HYDRODIURIL) 25 MG tablet Take 25 mg by mouth daily.   Yes [provider]  losartan (COZAAR) 100 MG tablet  06/06/18  Yes [provider]  metFORMIN (GLUCOPHAGE-XR) 500 MG 24 hr tablet  05/10/18  Yes [provider]  erythromycin ophthalmic ointment Place a 1/2 inch ribbon of ointment into the lower eyelid four times daily for one week. 08/06/18   Alvira Monday, MD    Family History No family history on file.  Social History Social History   Tobacco Use  . Smoking status: Current Every Day Smoker  . Smokeless tobacco: Never Used   Substance Use Topics  . Alcohol use: No  . Drug use: Never     Allergies   Penicillins   Review of Systems Review of Systems  Respiratory: Negative for shortness of breath.   Cardiovascular: Negative for chest pain.  Gastrointestinal: Positive for abdominal pain. Negative for vomiting.  Genitourinary: Positive for dysuria and frequency. Negative for decreased urine volume.  Musculoskeletal: Positive for back pain.  All other systems reviewed and are negative.    Physical Exam Updated Vital Signs BP (!) 142/78 (BP Location: Right Arm)   Pulse (!) 109   Temp 98.3 F (36.8 C) (Oral)   Resp 20   Ht  (1.651 m)   Wt 86.2 kg   SpO2 99%   BMI 31.62 kg/m   Physical Exam Vitals signs and nursing note reviewed.  Constitutional:      Appearance: She is well-developed. She is obese.  HENT:     Head: Normocephalic and atraumatic.     Right Ear: External ear normal.     Left Ear: External ear normal.     Nose: Nose normal.  Eyes:     General:        Right eye: No discharge.        Left eye: No discharge.  Cardiovascular:  Rate and Rhythm: Normal rate and regular rhythm.     Heart sounds: Normal heart sounds.  Pulmonary:     Effort: Pulmonary effort is normal.     Breath sounds: Normal breath sounds.  Abdominal:     Palpations: Abdomen is soft.     Tenderness: There is abdominal tenderness (mild) in the right lower quadrant. There is right CVA tenderness (mild).  Skin:    General: Skin is warm and dry.  Neurological:     Mental Status: She is alert.  Psychiatric:        Mood and Affect: Mood is not anxious.      ED Treatments / Results  Labs (all labs ordered are listed, but only abnormal results are displayed) Labs Reviewed  URINALYSIS, ROUTINE W REFLEX MICROSCOPIC - Abnormal; Notable for the following components:      Result Value   Hgb urine dipstick TRACE (*)    Protein, ur 100 (*)    All other components within normal limits  COMPREHENSIVE  METABOLIC PANEL - Abnormal; Notable for the following components:   Glucose, Bld 145 (*)    Creatinine, Ser 1.61 (*)    Total Protein 8.5 (*)    GFR calc non Af Amer 34 (*)    GFR calc Af Amer 39 (*)    All other components within normal limits  CBC WITH DIFFERENTIAL/PLATELET - Abnormal; Notable for the following components:   WBC 13.5 (*)    Neutro Abs 8.1 (*)    Lymphs Abs 4.3 (*)    All other components within normal limits  URINALYSIS, MICROSCOPIC (REFLEX) - Abnormal; Notable for the following components:   Bacteria, UA RARE (*)    All other components within normal limits    EKG None  Radiology Ct Renal Stone Study  Result Date: 09/12/2018 CLINICAL DATA:  Initial evaluation for acute mid back pain and lower abdominal pain. EXAM: CT ABDOMEN AND PELVIS WITHOUT CONTRAST TECHNIQUE: Multidetector CT imaging of the abdomen and pelvis was performed following the standard protocol without IV contrast. COMPARISON:  Prior ultrasound from 06/20/2017. FINDINGS: Lower chest: Visualized lung bases are clear. Hepatobiliary: Limited noncontrast evaluation liver is unremarkable. Gallbladder surgically absent. No biliary dilatation. Pancreas: Scattered punctate calcifications noted within the pancreas, suggesting chronic pancreatitis. No acute peripancreatic inflammation identified. No abnormal pancreatic ductal dilatation. Spleen: Spleen within normal limits. Adrenals/Urinary Tract: 2.4 cm hypodense left adrenal nodule, most compatible with in adenoma. Right adrenal gland unremarkable. Kidneys equal in size without nephrolithiasis or hydronephrosis. No radiopaque calculi seen along the course of either renal collecting system. No hydroureter. Partially distended bladder within normal limits. No layering stones within the bladder lumen. Stomach/Bowel: Stomach within normal limits. No evidence for bowel obstruction. Normal appendix. Colonic diverticulosis without evidence for acute diverticulitis. No acute  inflammatory changes seen about the bowels. Vascular/Lymphatic: Intra-abdominal aorta of normal caliber. Mild aortic atherosclerosis about the aortic bifurcation. No adenopathy. Reproductive: Uterus and ovaries within normal limits. Other: She no free air or fluid. Small fat containing paraumbilical hernia noted without associated inflammation. Musculoskeletal: No acute osseous finding. No discrete lytic or blastic osseous lesions. Chronic facet mediated grade 1 anterolisthesis of L4 on L5. Symmetric sclerosis about the anterior SI joints bilaterally, suggesting sacroiliitis. Soft tissue thickening about the pubis symphysis mildly invaginating upon the base of the bladder, likely degenerative. IMPRESSION: 1. No CT evidence for nephrolithiasis or obstructive uropathy. 2. No other acute intra-abdominal or pelvic process. 3. Colonic diverticulosis without evidence for acute diverticulitis. 4. Sequelae  of chronic pancreatitis. Electronically Signed   By: Rise MuBenjamin  McClintock M.D.   On: 09/12/2018 20:16    Procedures Procedures (including critical care time)  Medications Ordered in ED Medications  sodium chloride 0.9 % bolus 1,000 mL (0 mLs Intravenous Stopped 09/12/18 2039)     Initial Impression / Assessment and Plan / ED Course  I have reviewed the triage vital signs and the nursing notes.  Pertinent labs & imaging results that were available during my care of the patient were reviewed by me and considered in my medical decision making (see chart for details).        No clear cause for the patient's back pain.  Work-up here is unremarkable except for minimal blood in the urine and mild leukocytosis.  CT scan does not show an obvious ureteral stone or other acute abdominal emergency.  There is no UTI.  Unable to explain her symptoms at this time but with how much pain she has with changing position this could be muscular.  I have advised her to use Tylenol and heat to the area.  We discussed return  precautions but she is not show any signs of a spinal emergency such as weakness or numbness in her extremities.  Strict return precautions.  Final Clinical Impressions(s) / ED Diagnoses   Final diagnoses:  Acute right-sided low back pain without sciatica    ED Discharge Orders    None       Pricilla LovelessGoldston, Donat Humble, MD 09/12/18 2041

## 2018-09-12 NOTE — ED Triage Notes (Signed)
Gas like mid back and lower abdominal pain x 3 days.

## 2018-09-12 NOTE — Discharge Instructions (Addendum)
If you develop worsening, recurrent, or continued back pain, numbness or weakness in the legs, incontinence of your bowels or bladders, numbness of your buttocks, fever, abdominal pain, or any other new/concerning symptoms then return to the ER for evaluation.  

## 2019-12-20 ENCOUNTER — Other Ambulatory Visit: Payer: Self-pay

## 2019-12-20 ENCOUNTER — Emergency Department (HOSPITAL_BASED_OUTPATIENT_CLINIC_OR_DEPARTMENT_OTHER)
Admission: EM | Admit: 2019-12-20 | Discharge: 2019-12-20 | Disposition: A | Discharge status: Home / Self Care | Payer: BLUE CROSS/BLUE SHIELD | Attending: Emergency Medicine | Admitting: Emergency Medicine

## 2019-12-20 DIAGNOSIS — F17210 Nicotine dependence, cigarettes, uncomplicated: Secondary | ICD-10-CM | POA: Insufficient documentation

## 2019-12-20 DIAGNOSIS — H66002 Acute suppurative otitis media without spontaneous rupture of ear drum, left ear: Secondary | ICD-10-CM | POA: Diagnosis not present

## 2019-12-20 DIAGNOSIS — Z79899 Other long term (current) drug therapy: Secondary | ICD-10-CM | POA: Insufficient documentation

## 2019-12-20 DIAGNOSIS — Z7984 Long term (current) use of oral hypoglycemic drugs: Secondary | ICD-10-CM | POA: Diagnosis not present

## 2019-12-20 DIAGNOSIS — H9202 Otalgia, left ear: Secondary | ICD-10-CM | POA: Diagnosis present

## 2019-12-20 DIAGNOSIS — I10 Essential (primary) hypertension: Secondary | ICD-10-CM | POA: Insufficient documentation

## 2019-12-20 DIAGNOSIS — E119 Type 2 diabetes mellitus without complications: Secondary | ICD-10-CM | POA: Insufficient documentation

## 2019-12-20 DIAGNOSIS — R6884 Jaw pain: Secondary | ICD-10-CM | POA: Diagnosis not present

## 2019-12-20 MED ORDER — AZITHROMYCIN 250 MG PO TABS: 250.0000 mg | ORAL_TABLET | Freq: Every day | ORAL | 0 refills | Status: AC

## 2019-12-20 MED ORDER — AZITHROMYCIN 250 MG PO TABS
500.0000 mg | ORAL_TABLET | Freq: Once | ORAL | Status: AC
  Administered 2019-12-20: 500 mg via ORAL
  Filled 2019-12-20: qty 2

## 2019-12-20 NOTE — Discharge Instructions (Signed)
Please read and follow all provided instructions.  Your diagnoses today include:  1. Non-recurrent acute suppurative otitis media of left ear without spontaneous rupture of tympanic membrane      Tests performed today include: Vital signs. See below for your results today.   Medications prescribed:  Azithromycin - antibiotic for ear infection  You have been prescribed an antibiotic medicine: take the entire course of medicine even if you are feeling better. Stopping early can cause the antibiotic not to work.  Take any prescribed medications only as directed.  Home care instructions:  Follow any educational materials contained in this packet.  BE VERY CAREFUL not to take multiple medicines containing Tylenol (also called acetaminophen). Doing so can lead to an overdose which can damage your liver and cause liver failure and possibly death.   Follow-up instructions: Please follow-up with your primary care provider in the next 3 days for further evaluation of your symptoms.   Return instructions:  Please return to the Emergency Department if you experience worsening symptoms.  Please return if you have any other emergent concerns.  Additional Information:  Your vital signs today were: BP (!) 179/86   Pulse 91   Temp 98.9 F (37.2 C)   Resp 18   SpO2 99%  If your blood pressure (BP) was elevated above 135/85 this visit, please have this repeated by your doctor within one month. --------------

## 2019-12-20 NOTE — ED Notes (Signed)
AVS reviewed with client along with pt teaching about ABX rx received as well, copy of AVS also provided to client as well

## 2019-12-20 NOTE — ED Provider Notes (Signed)
[facility] EMERGENCY DEPARTMENT Provider Note   CSN: [id number] Arrival date & time: 12/20/19  1707     History Chief Complaint  Patient presents with  . Otalgia  . Jaw Pain    Melissa Carlson is a 64 y.o. female.  Patient with history of diabetes, high cholesterol, hypertension, chronic kidney disease presents to the emergency department for left ear pain and left jaw pain over the past 2 days.  No fevers.  No significant facial swelling.  Patient treating with Goody powder without improvement.  No difficulty swallowing or breathing.  Pain radiates from her ear into her jaw.  History of penicillin allergy.  Treated for sinusitis in 2020 with azithromycin.        Past Medical History:  Diagnosis Date  . Arthritis   . Diabetes mellitus without complication (HCC)   . High cholesterol   . Hypertension     There are no problems to display for this patient.   Past Surgical History:  Procedure Laterality Date  . CHOLECYSTECTOMY       OB History   No obstetric history on file.     No family history on file.  Social History   Tobacco Use  . Smoking status: Current Every Day Smoker  . Smokeless tobacco: Never Used  Substance Use Topics  . Alcohol use: No  . Drug use: Never    Home Medications Prior to Admission medications   Medication Sig Start Date End Date Taking? Authorizing Provider  erythromycin ophthalmic ointment Place a 1/2 inch ribbon of ointment into the lower eyelid four times daily for one week. 08/06/18   Alvira Monday, MD  hydrochlorothiazide (HYDRODIURIL) 25 MG tablet Take 25 mg by mouth daily.    [provider]  losartan (COZAAR) 100 MG tablet  06/06/18   [provider]  metFORMIN (GLUCOPHAGE-XR) 500 MG 24 hr tablet  05/10/18   [provider]    Allergies    Penicillins  Review of Systems   Review of Systems  Constitutional: Negative for fever.  HENT: Positive for dental problem and ear pain.  Negative for congestion, rhinorrhea, sinus pressure and sore throat.   Eyes: Negative for redness.  Respiratory: Negative for shortness of breath.   Neurological: Negative for headaches.  Psychiatric/Behavioral: Positive for sleep disturbance.    Physical Exam Updated Vital Signs BP (!) 179/86   Pulse 91   Temp 98.9 F (37.2 C)   Resp 18   SpO2 99%   Physical Exam Vitals and nursing note reviewed.  Constitutional:      Appearance: She is well-developed.  HENT:     Head: Normocephalic and atraumatic.     Jaw: No trismus.     Right Ear: Tympanic membrane, ear canal and external ear normal.     Left Ear: Ear canal and external ear normal. Tympanic membrane is erythematous and bulging.     Nose: Nose normal.     Mouth/Throat:     Dentition: Abnormal dentition. Dental caries present. No dental abscesses.     Pharynx: Uvula midline. No uvula swelling.     Tonsils: No tonsillar abscesses.     Comments: Several missing teeth.  Partial in place.  No gross dental abscess. Eyes:     Conjunctiva/sclera: Conjunctivae normal.  Neck:     Comments: No neck swelling or Ludwig's angina Musculoskeletal:     Cervical back: Normal range of motion and neck supple.  Lymphadenopathy:     Cervical: No cervical adenopathy.  Skin:    General: Skin is warm and dry.  Neurological:     Mental Status: She is alert.     ED Results / Procedures / Treatments   Labs (all labs ordered are listed, but only abnormal results are displayed) Labs Reviewed - No data to display  EKG None  Radiology No results found.  Procedures Procedures (including critical care time)  Medications Ordered in ED Medications  azithromycin (ZITHROMAX) tablet 500 mg (has no administration in time range)    ED Course  I have reviewed the triage vital signs and the nursing notes.  Pertinent labs & imaging results that were available during my care of the patient were reviewed by me and considered in my medical  decision making (see chart for details).  Patient seen and examined.  Patient will be started on azithromycin for suspected otitis media.  No obvious dental infection on exam.  Patient counseled to minimize NSAID use due to chronic kidney disease.  She drove to the emergency department tonight.  Vital signs reviewed and are as follows: BP (!) 179/86   Pulse 91   Temp 98.9 F (37.2 C)   Resp 18   SpO2 99%   Encourage PCP follow-up next week for recheck if not improved.    MDM Rules/Calculators/A&P                          Patient with ear and jaw pain on the left side.  Her left TM is erythematous and bulging.  Suspect that her symptoms are related to otitis media.  Started on azithromycin.  Patient was seen for viral respiratory symptoms 8 days ago.    Final Clinical Impression(s) / ED Diagnoses Final diagnoses:  Non-recurrent acute suppurative otitis media of left ear without spontaneous rupture of tympanic membrane    Rx / DC Orders ED Discharge Orders         Ordered    azithromycin (ZITHROMAX) 250 MG tablet  Daily     Discontinue  Reprint     12/20/19 1858           Renne Crigler, PA-C 12/20/19 1900    Charlynne Pander, MD 12/21/19 [phone]

## 2019-12-20 NOTE — ED Triage Notes (Signed)
PT here with left sided jaw pain/otalgia since Thursday.

## 2020-10-29 IMAGING — CT CT RENAL STONE PROTOCOL
2 of 4 series · 16 of 46 positions shown, 18 images · non-contrast
Comparison: Prior ultrasound from 06/20/2017.

CLINICAL DATA: Initial evaluation for acute mid back pain and lower
abdominal pain.

EXAM:
CT ABDOMEN AND PELVIS WITHOUT CONTRAST
TECHNIQUE: Multidetector CT imaging of the abdomen and pelvis was performed
following the standard protocol without IV contrast.

[Series 2: axial st · axial · 0.86mm/px · z∈[-466,-11]mm · 13 of 99 slices shown, 15 images]
[im 4/99  soft-tissue]
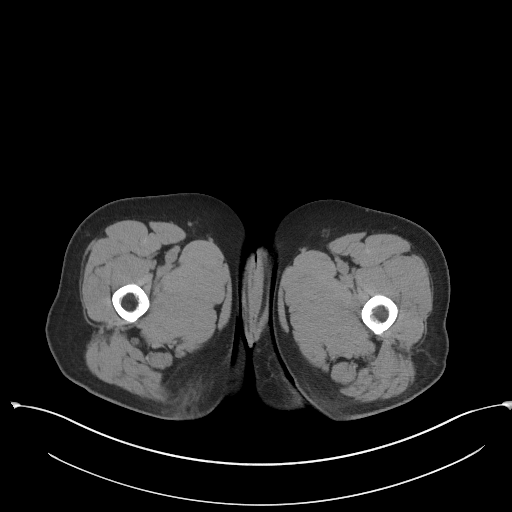
[im 4/99  bone]
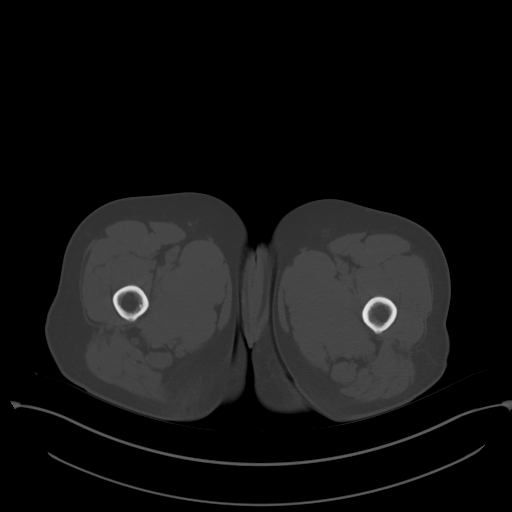
[im 12/99  soft-tissue]
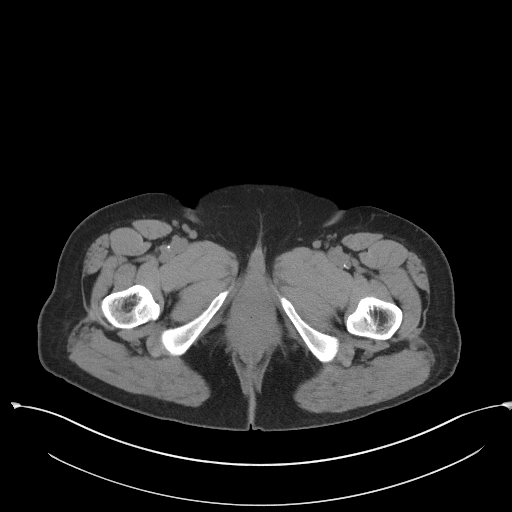
[im 19/99  soft-tissue]
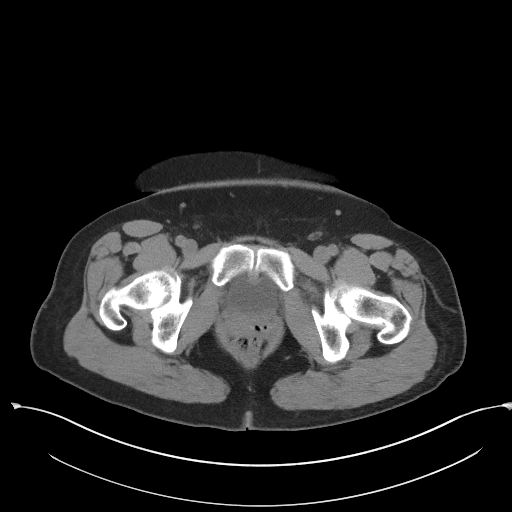
[im 27/99  soft-tissue]
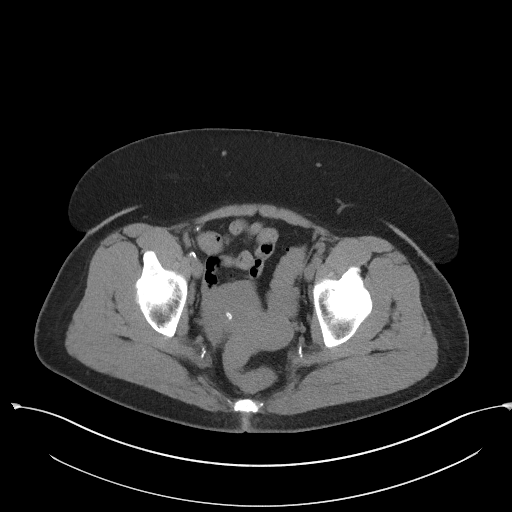
[im 34/99  soft-tissue]
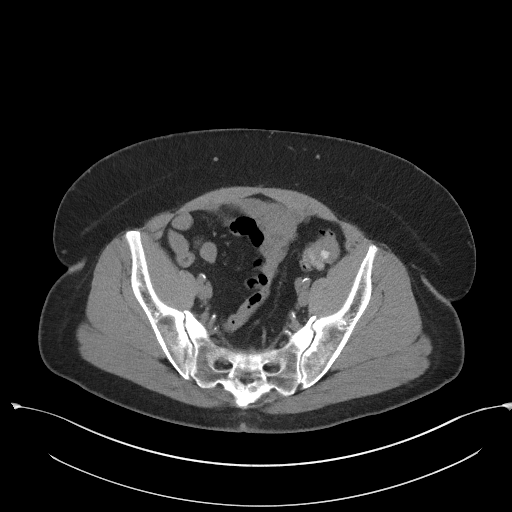
[im 42/99  soft-tissue]
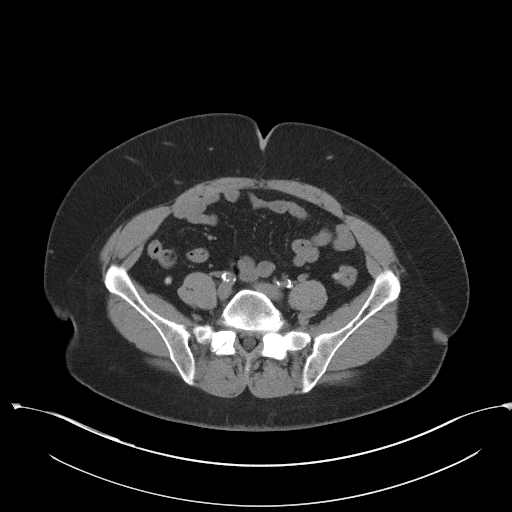
[im 50/99  soft-tissue]
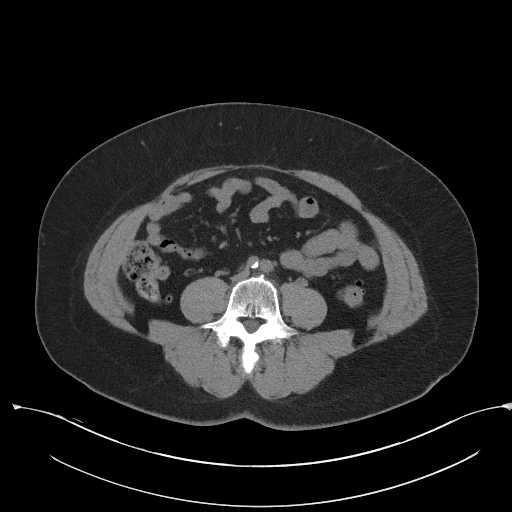
[im 57/99  soft-tissue]
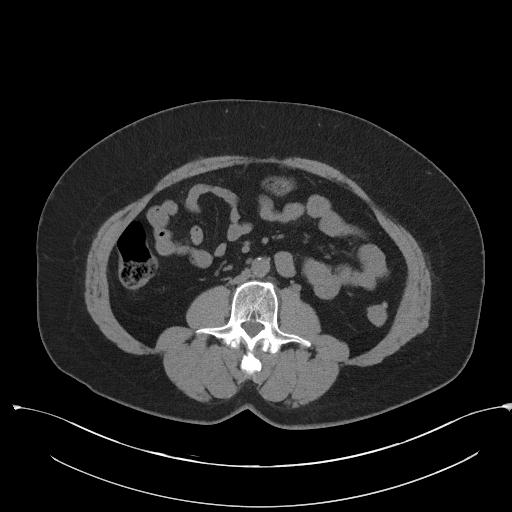
[im 65/99  soft-tissue]
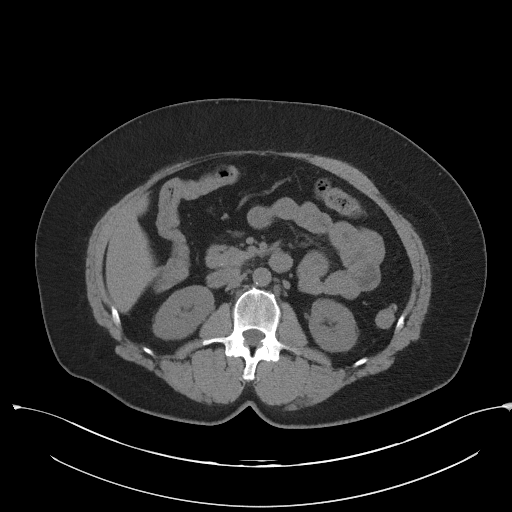
[im 65/99  bone]
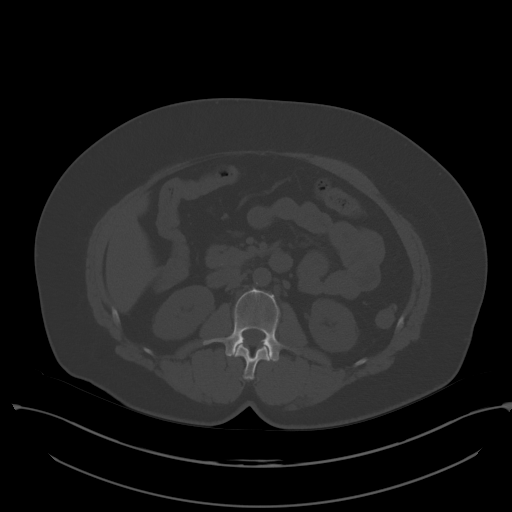
[im 72/99  soft-tissue]
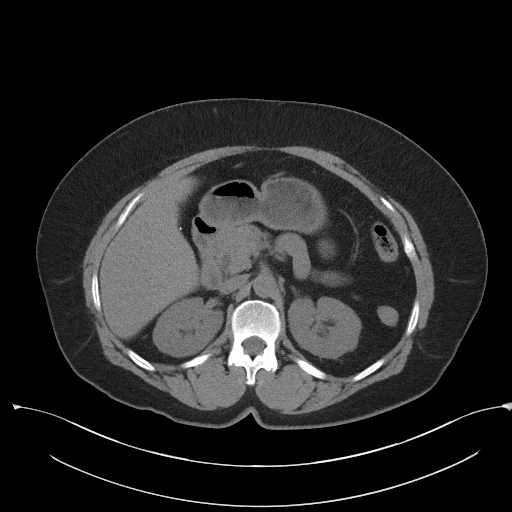
[im 80/99  soft-tissue]
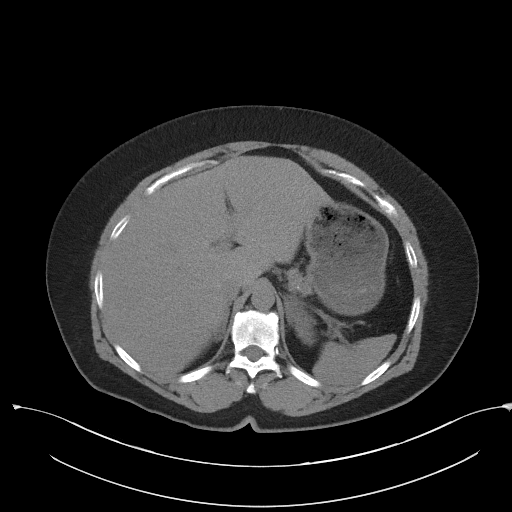
[im 87/99  soft-tissue]
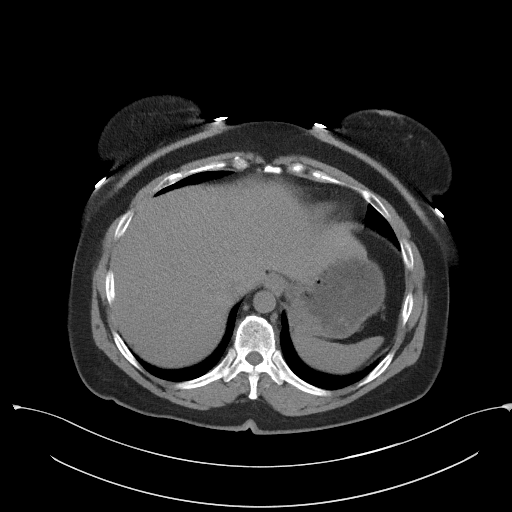
[im 95/99  soft-tissue]
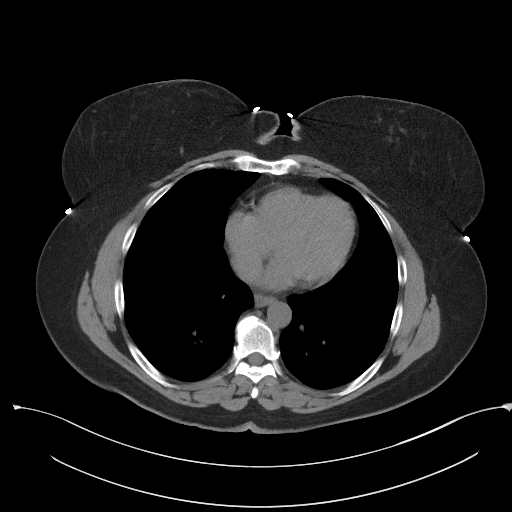

[Series 4: coronal st · coronal · 0.90mm/px · 3 of 101 slices shown]
[im 34/101  soft-tissue]
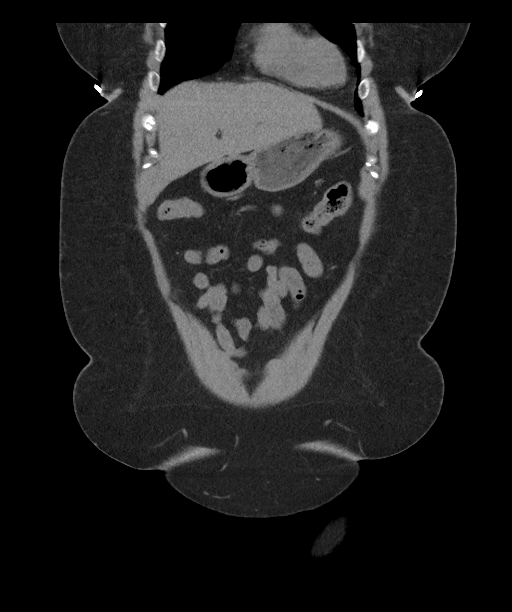
[im 45/101  soft-tissue]
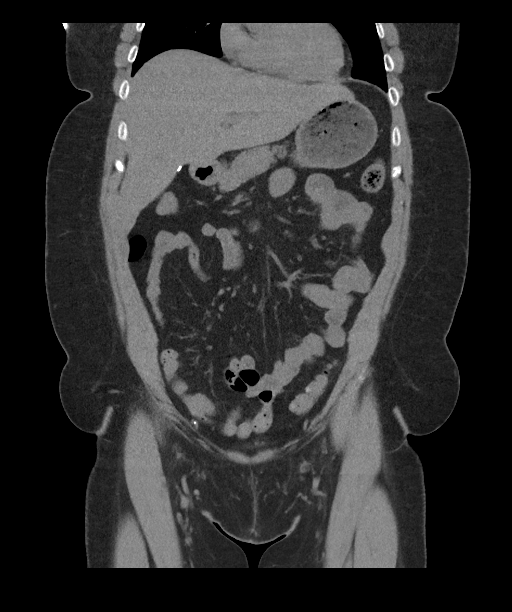
[im 56/101  soft-tissue]
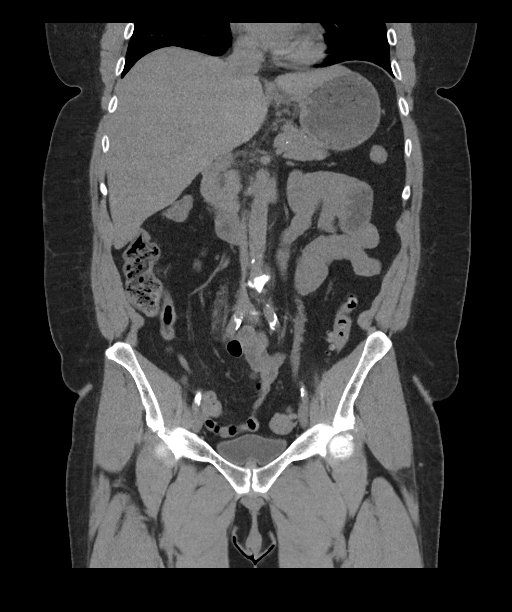

[16 of 46 positions shown; findings below may reference images not displayed]

FINDINGS: Lower chest: Visualized lung bases are clear.

Hepatobiliary: Limited noncontrast evaluation liver is unremarkable.
Gallbladder surgically absent. No biliary dilatation.

Pancreas: Scattered punctate calcifications noted within the
pancreas, suggesting chronic pancreatitis. No acute peripancreatic
inflammation identified. No abnormal pancreatic ductal dilatation.

Spleen: Spleen within normal limits.

Adrenals/Urinary Tract: 2.4 cm hypodense left adrenal nodule, most
compatible with in adenoma. Right adrenal gland unremarkable.

Kidneys equal in size without nephrolithiasis or hydronephrosis. No
radiopaque calculi seen along the course of either renal collecting
system. No hydroureter. Partially distended bladder within normal
limits. No layering stones within the bladder lumen.

Stomach/Bowel: Stomach within normal limits. No evidence for bowel
obstruction. Normal appendix. Colonic diverticulosis without
evidence for acute diverticulitis. No acute inflammatory changes
seen about the bowels.

Vascular/Lymphatic: Intra-abdominal aorta of normal caliber. Mild
aortic atherosclerosis about the aortic bifurcation. No adenopathy.

Reproductive: Uterus and ovaries within normal limits.

Other: She no free air or fluid. Small fat containing paraumbilical
hernia noted without associated inflammation.

Musculoskeletal: No acute osseous finding. No discrete lytic or
blastic osseous lesions. Chronic facet mediated grade 1
anterolisthesis of L4 on L5. Symmetric sclerosis about the anterior
SI joints bilaterally, suggesting sacroiliitis. Soft tissue
thickening about the pubis symphysis mildly invaginating upon the
base of the bladder, likely degenerative.
IMPRESSION: 1. No CT evidence for nephrolithiasis or obstructive uropathy.
2. No other acute intra-abdominal or pelvic process.
3. Colonic diverticulosis without evidence for acute diverticulitis.
4. Sequelae of chronic pancreatitis.

## 2021-01-17 ENCOUNTER — Encounter (HOSPITAL_BASED_OUTPATIENT_CLINIC_OR_DEPARTMENT_OTHER): Payer: Self-pay | Admitting: *Deleted

## 2021-01-17 ENCOUNTER — Emergency Department (HOSPITAL_BASED_OUTPATIENT_CLINIC_OR_DEPARTMENT_OTHER)
Admission: EM | Admit: 2021-01-17 | Discharge: 2021-01-17 | Disposition: A | Discharge status: Home / Self Care | Payer: BLUE CROSS/BLUE SHIELD | Attending: Emergency Medicine | Admitting: Emergency Medicine

## 2021-01-17 ENCOUNTER — Other Ambulatory Visit: Payer: Self-pay

## 2021-01-17 DIAGNOSIS — E119 Type 2 diabetes mellitus without complications: Secondary | ICD-10-CM | POA: Insufficient documentation

## 2021-01-17 DIAGNOSIS — Z79899 Other long term (current) drug therapy: Secondary | ICD-10-CM | POA: Diagnosis not present

## 2021-01-17 DIAGNOSIS — Z7984 Long term (current) use of oral hypoglycemic drugs: Secondary | ICD-10-CM | POA: Insufficient documentation

## 2021-01-17 DIAGNOSIS — J029 Acute pharyngitis, unspecified: Secondary | ICD-10-CM | POA: Diagnosis present

## 2021-01-17 DIAGNOSIS — Z20822 Contact with and (suspected) exposure to covid-19: Secondary | ICD-10-CM | POA: Diagnosis not present

## 2021-01-17 DIAGNOSIS — I10 Essential (primary) hypertension: Secondary | ICD-10-CM | POA: Diagnosis not present

## 2021-01-17 DIAGNOSIS — F17200 Nicotine dependence, unspecified, uncomplicated: Secondary | ICD-10-CM | POA: Insufficient documentation

## 2021-01-17 LAB — RESP PANEL BY RT-PCR (FLU A&B, COVID) ARPGX2
Influenza A by PCR: NEGATIVE
Influenza B by PCR: NEGATIVE
SARS Coronavirus 2 by RT PCR: NEGATIVE

## 2021-01-17 LAB — GROUP A STREP BY PCR: Group A Strep by PCR: NOT DETECTED

## 2021-01-17 MED ORDER — BENZONATATE 100 MG PO CAPS: 100.0000 mg | ORAL_CAPSULE | Freq: Three times a day (TID) | ORAL | 0 refills | Status: AC

## 2021-01-17 NOTE — Discharge Instructions (Addendum)
As we discussed I do not see any signs of a bacterial infection, I do not believe that you need antibiotics today.  Continue to take your allergy medication, ibuprofen and Tylenol for pain relief, you may take the Tessalon medication that I have sent in for you to help with the sore throat, versus over-the-counter Robitussin.  If you begin to have a fever, have difficulty swallowing, your sore throat worsens or fails to improve recommend further evaluation by your primary care, urgent care, or you may return to [place].

## 2021-01-17 NOTE — ED Triage Notes (Signed)
Sore throat x 4 days. No other symptoms.

## 2021-01-17 NOTE — ED Provider Notes (Signed)
[facility] EMERGENCY DEPARTMENT Provider Note   CSN: [id number] Arrival date & time: 01/17/21  1128     History Chief Complaint  Patient presents with   Sore Throat    Melissa Carlson is a 65 y.o. female with a past medical history significant for allergies, presents with 4 days of sore throat, runny nose, without cough, fever, taste change, shortness of breath.  Patient reports that she often has a sore throat associated with her allergies, she takes Allegra regularly, and has been using throat lozenges, however she still reports that she has been having some pain with swallowing.   Sore Throat      Past Medical History:  Diagnosis Date   Arthritis    Diabetes mellitus without complication (HCC)    High cholesterol    Hypertension     There are no problems to display for this patient.   Past Surgical History:  Procedure Laterality Date   CHOLECYSTECTOMY       OB History   No obstetric history on file.     No family history on file.  Social History   Tobacco Use   Smoking status: Every Day   Smokeless tobacco: Never  Substance Use Topics   Alcohol use: No   Drug use: Never    Home Medications Prior to Admission medications   Medication Sig Start Date End Date Taking? Authorizing Provider  benzonatate (TESSALON) 100 MG capsule Take 1 capsule (100 mg total) by mouth every 8 (eight) hours. 01/17/21  Yes Javin Nong H, PA-C  hydrochlorothiazide (HYDRODIURIL) 25 MG tablet Take 25 mg by mouth daily.   Yes [provider]  losartan (COZAAR) 100 MG tablet  06/06/18  Yes [provider]  metFORMIN (GLUCOPHAGE-XR) 500 MG 24 hr tablet  05/10/18  Yes [provider]  azithromycin (ZITHROMAX) 250 MG tablet Take 1 tablet (250 mg total) by mouth daily. 12/20/19   Renne Crigler, PA-C  erythromycin ophthalmic ointment Place a 1/2 inch ribbon of ointment into the lower eyelid four times daily for one week. 08/06/18   Alvira Monday,  MD    Allergies    Penicillins  Review of Systems   Review of Systems  HENT:  Positive for rhinorrhea and sore throat.   All other systems reviewed and are negative.  Physical Exam Updated Vital Signs BP (!) 145/83 (BP Location: Right Arm)   Pulse 94   Temp 98.5 F (36.9 C) (Oral)   Resp 16   Ht 5\' 5"  (1.651 m)   Wt 81.3 kg   SpO2 98%   BMI 29.82 kg/m   Physical Exam Vitals and nursing note reviewed.  Constitutional:      General: She is not in acute distress.    Appearance: Normal appearance.  HENT:     Head: Normocephalic and atraumatic.     Mouth/Throat:     Mouth: Mucous membranes are moist.     Pharynx: Oropharynx is clear. Uvula midline. No oropharyngeal exudate or uvula swelling.     Tonsils: No tonsillar exudate or tonsillar abscesses. 0 on the right. 0 on the left.  Eyes:     General:        Right eye: No discharge.        Left eye: No discharge.     Conjunctiva/sclera: Conjunctivae normal.     Pupils: Pupils are equal, round, and reactive to light.  Cardiovascular:     Rate and Rhythm: Normal rate and regular rhythm.  Pulmonary:  Effort: Pulmonary effort is normal. No respiratory distress.  Musculoskeletal:        General: No deformity.     Cervical back: Normal range of motion and neck supple.  Lymphadenopathy:     Cervical: No cervical adenopathy.  Skin:    General: Skin is warm and dry.  Neurological:     Mental Status: She is alert and oriented to person, place, and time.  Psychiatric:        Mood and Affect: Mood normal.        Behavior: Behavior normal.    ED Results / Procedures / Treatments   Labs (all labs ordered are listed, but only abnormal results are displayed) Labs Reviewed  RESP PANEL BY RT-PCR (FLU A&B, COVID) [id number]  GROUP A STREP BY PCR    EKG None  Radiology No results found.  Procedures Procedures   Medications Ordered in ED Medications - No data to display  ED Course  I have reviewed the triage vital  signs and the nursing notes.  Pertinent labs & imaging results that were available during my care of the patient were reviewed by me and considered in my medical decision making (see chart for details).    MDM Rules/Calculators/A&P                         No unilateral swelling, no lymphadenopathy, patient with patent airway, no difficulties breathing, no stridor.  Do not favor Ludwig angina, peritonsillar abscess, epiglottitis, or other bacterial etiology at this time.  COVID, flu, strep swabs are all negative.  As patient reports that she often has a sore throat associated with her allergies favor some sort of postnasal drip, versus viral infection as the etiology for patient sore throat.  Will prescribe Tessalon, for relief, patient should continue take allergy medication, as well as ibuprofen and Tylenol for pain relief.  Return precautions given including difficulty swallowing, difficulty breathing, fever, unilateral swelling of throat or jaw. Final Clinical Impression(s) / ED Diagnoses Final diagnoses:  Sore throat    Rx / DC Orders ED Discharge Orders          Ordered    benzonatate (TESSALON) 100 MG capsule  Every 8 hours        01/17/21 1331             Mannie Ohlin, [place] H, PA-C 01/17/21 1333    Virgina Norfolk, DO 01/17/21 1541

## 2021-01-17 NOTE — ED Notes (Signed)
ED Provider at bedside.
